# Patient Record
Sex: Female | Born: 2000 | Race: White | Hispanic: No | Marital: Single | Smoking: Never smoker
Health system: Southern US, Community
[De-identification: ages and names within clinical notes are randomized; demographics above are authoritative.]

## PROBLEM LIST (undated history)

## (undated) HISTORY — PX: TONSILLECTOMY: SUR1361

## (undated) HISTORY — PX: EYE SURGERY: SHX253

---

## 2020-04-12 ENCOUNTER — Encounter (HOSPITAL_BASED_OUTPATIENT_CLINIC_OR_DEPARTMENT_OTHER): Payer: Self-pay | Admitting: *Deleted

## 2020-04-12 ENCOUNTER — Emergency Department (HOSPITAL_BASED_OUTPATIENT_CLINIC_OR_DEPARTMENT_OTHER)
Admission: EM | Admit: 2020-04-12 | Discharge: 2020-04-13 | Disposition: A | Discharge status: Home / Self Care | Payer: Medicaid Other | Attending: Emergency Medicine | Admitting: Emergency Medicine

## 2020-04-12 ENCOUNTER — Other Ambulatory Visit: Payer: Self-pay

## 2020-04-12 DIAGNOSIS — R55 Syncope and collapse: Secondary | ICD-10-CM | POA: Diagnosis not present

## 2020-04-12 DIAGNOSIS — S0993XA Unspecified injury of face, initial encounter: Secondary | ICD-10-CM | POA: Diagnosis present

## 2020-04-12 DIAGNOSIS — W19XXXA Unspecified fall, initial encounter: Secondary | ICD-10-CM | POA: Diagnosis not present

## 2020-04-12 DIAGNOSIS — Z91040 Latex allergy status: Secondary | ICD-10-CM | POA: Insufficient documentation

## 2020-04-12 DIAGNOSIS — R7309 Other abnormal glucose: Secondary | ICD-10-CM | POA: Insufficient documentation

## 2020-04-12 DIAGNOSIS — S022XXA Fracture of nasal bones, initial encounter for closed fracture: Secondary | ICD-10-CM | POA: Diagnosis not present

## 2020-04-12 LAB — CBG MONITORING, ED: Glucose-Capillary: 113 mg/dL — ABNORMAL HIGH (ref 70–99)

## 2020-04-12 NOTE — ED Triage Notes (Signed)
She went with her friend for a body piercing. Suddenly she passed out falling forward. Injury to her nose, upper gums. She has a small hematoma over her right eye with superficial abrasions.

## 2020-04-13 ENCOUNTER — Emergency Department (HOSPITAL_BASED_OUTPATIENT_CLINIC_OR_DEPARTMENT_OTHER): Payer: Medicaid Other

## 2020-04-13 NOTE — Discharge Instructions (Signed)
Apply ice for 30 minutes at a time, 4 times a day. ° °Take acetaminophen or ibuprofen as needed for pain. °

## 2020-04-13 NOTE — ED Notes (Signed)
Patient transported to CT 

## 2020-04-13 NOTE — ED Provider Notes (Signed)
[facility] EMERGENCY DEPARTMENT Provider Note   CSN: [id number] Arrival date & time: 04/12/20  2106   History Chief complaint: Facial injury  Kathy Ramos is a 19 y.o. female.  The history is provided by the patient.  She was with a friend who is getting her nose pierced when patient suddenly felt and passed out.  She woke up on the floor with her nose bleeding and some bruising to her upper lip and abrasions to her forehead.  She did feel sweaty and nauseous prior to passing out.  History reviewed. No pertinent past medical history.  There are no problems to display for this patient.   Past Surgical History:  Procedure Laterality Date  . EYE SURGERY    . TONSILLECTOMY       OB History   No obstetric history on file.     No family history on file.  Social History   Tobacco Use  . Smoking status: Never Smoker  . Smokeless tobacco: Never Used  Substance Use Topics  . Alcohol use: Never  . Drug use: Never    Home Medications Prior to Admission medications   Not on File    Allergies    Latex  Review of Systems   Review of Systems  All other systems reviewed and are negative.   Physical Exam Updated Vital Signs BP 115/77 (BP Location: Right Arm)   Pulse 96   Temp 98.6 F (37 C) (Oral)   Resp 18   Ht 5\' 5"  (1.651 m)   Wt 71.2 kg   LMP 03/15/2020   SpO2 100%   BMI 26.13 kg/m   Physical Exam Vitals and nursing note reviewed.   19 year old female, resting comfortably and in no acute distress. Vital signs are normal. Oxygen saturation is 100%, which is normal. Head is normocephalic.  2 small superficial lacerations are present on the forehead, not requiring closure. PERRLA, EOMI. Oropharynx is clear.  No active nose bleeding.  Septum is in the midline.  No obvious bleeding site seen.  There is tenderness to palpation over the bridge of the nose without any swelling seen.  Some swelling noted of the upper lip and some bleeding of the gingiva  around tooth #8 and 9.  Teeth are not loosened. Neck is nontender and supple without adenopathy or JVD. Back is nontender and there is no CVA tenderness. Lungs are clear without rales, wheezes, or rhonchi. Chest is nontender. Heart has regular rate and rhythm without murmur. Abdomen is soft, flat, nontender without masses or hepatosplenomegaly and peristalsis is normoactive. Extremities have no cyanosis or edema, full range of motion is present. Skin is warm and dry without rash. Neurologic: Mental status is normal, cranial nerves are intact, there are no motor or sensory deficits.  ED Results / Procedures / Treatments   Labs (all labs ordered are listed, but only abnormal results are displayed) Labs Reviewed  CBG MONITORING, ED - Abnormal; Notable for the following components:      Result Value   Glucose-Capillary 113 (*)    All other components within normal limits  PREGNANCY, URINE    EKG EKG Interpretation  Date/Time:  Thursday April 12 2020 21:22:55 EST Ventricular Rate:  109 PR Interval:  152 QRS Duration: 82 QT Interval:  342 QTC Calculation: 460 R Axis:   69 Text Interpretation: Sinus tachycardia Otherwise normal ECG No old tracing to compare Confirmed by [id number] (Dione Booze) on 04/12/2020 11:58:22 PM   Radiology CT Maxillofacial Wo  Contrast  Result Date: 04/13/2020 CLINICAL DATA:  Syncope while accompanying friend for body piercing. Larey Seat forward with injury to nose and upper gums, supraorbital hematoma with abrasions. EXAM: CT MAXILLOFACIAL WITHOUT CONTRAST TECHNIQUE: Multidetector CT imaging of the maxillofacial structures was performed. Multiplanar CT image reconstructions were also generated. COMPARISON:  None. FINDINGS: Osseous: No fracture of the bony orbits. Minimal deformity of the left nasal bone may reflect an acute fracture given some overlying swelling. No other mid face fractures are seen. The pterygoid plates are intact. No visible or suspected temporal  bone fractures. Temporomandibular joints are normally aligned. The mandible is intact. No fractured or avulsed teeth. Crown of the left first maxillary molar with periapical lucencies. Included cervical spine is free of acute osseous abnormality or traumatic listhesis. Orbits: Medial right supraorbital swelling confined to the preseptal tissues. No retro septal stranding or hemorrhage. The globes appear normal and symmetric. Symmetric appearance of the extraocular musculature and optic nerve sheath complexes. Normal caliber of the superior ophthalmic veins. Sinuses: Paranasal sinuses and mastoid air cells are predominantly clear. Soft tissues: Focal medial right supraorbital soft tissue swelling. Additional thickening across the nasal bridge and upper lip/philtrum Limited intracranial: No evidence of acute infarction, hemorrhage, hydrocephalus, extra-axial collection, visible mass lesion or mass effect. IMPRESSION: 1. Minimal deformity of the left nasal bone may reflect an acute fracture given some overlying swelling. Correlate with point tenderness. 2. No other visible facial bone fracture. 3. Medial right supraorbital soft tissue swelling confined to the preseptal tissues. No retro septal stranding or hemorrhage or other acute abnormality of the orbits. 4. Crown of the left first maxillary molar with periapical lucencies. Correlate with dental exam. Electronically Signed   By: Kreg Shropshire M.D.   On: 04/13/2020 00:22    Procedures Procedures   Medications Ordered in ED Medications - No data to display  ED Course  I have reviewed the triage vital signs and the nursing notes.  Pertinent labs & imaging results that were available during my care of the patient were reviewed by me and considered in my medical decision making (see chart for details).  MDM Rules/Calculators/A&P Syncope which sounds like vasovagal syncope.  ECG is normal except for slightly fast heart rate, glucose is minimally elevated at  113.  No additional work-up needed for syncope.  Facial injury, will send for CT to make sure there is no underlying facial fracture.  Old records are reviewed, and she has no relevant past records.  CT scan shows possible nasal fracture.  Given her to nasal tenderness, this is likely a true finding.  She is advised to apply ice, use over-the-counter analgesics as needed for pain, referred to ENT for follow-up if she has ongoing difficulty breathing or any cosmetic issues with the nasal healing.  Final Clinical Impression(s) / ED Diagnoses Final diagnoses:  Vasovagal syncope  Closed fracture of nasal bone, initial encounter    Rx / DC Orders ED Discharge Orders    None       Dione Booze, MD 04/13/20 0110

## 2021-02-10 ENCOUNTER — Emergency Department (HOSPITAL_BASED_OUTPATIENT_CLINIC_OR_DEPARTMENT_OTHER)
Admission: EM | Admit: 2021-02-10 | Discharge: 2021-02-11 | Disposition: A | Discharge status: Home / Self Care | Payer: Medicaid Other | Attending: Emergency Medicine | Admitting: Emergency Medicine

## 2021-02-10 ENCOUNTER — Other Ambulatory Visit: Payer: Self-pay

## 2021-02-10 ENCOUNTER — Encounter (HOSPITAL_BASED_OUTPATIENT_CLINIC_OR_DEPARTMENT_OTHER): Payer: Self-pay | Admitting: *Deleted

## 2021-02-10 ENCOUNTER — Emergency Department (HOSPITAL_BASED_OUTPATIENT_CLINIC_OR_DEPARTMENT_OTHER): Payer: Medicaid Other

## 2021-02-10 DIAGNOSIS — Z91040 Latex allergy status: Secondary | ICD-10-CM | POA: Diagnosis not present

## 2021-02-10 DIAGNOSIS — R059 Cough, unspecified: Secondary | ICD-10-CM | POA: Diagnosis not present

## 2021-02-10 DIAGNOSIS — J069 Acute upper respiratory infection, unspecified: Secondary | ICD-10-CM

## 2021-02-10 DIAGNOSIS — Z20822 Contact with and (suspected) exposure to covid-19: Secondary | ICD-10-CM | POA: Insufficient documentation

## 2021-02-10 LAB — RESP PANEL BY RT-PCR (FLU A&B, COVID) ARPGX2
Influenza A by PCR: NEGATIVE
Influenza B by PCR: NEGATIVE
SARS Coronavirus 2 by RT PCR: NEGATIVE

## 2021-02-10 MED ORDER — FLUTICASONE PROPIONATE 50 MCG/ACT NA SUSP: 2.0000 | Freq: Every day | NASAL | 0 refills | Status: AC

## 2021-02-10 MED ORDER — ACETAMINOPHEN 325 MG PO TABS: 650.0000 mg | ORAL_TABLET | Freq: Once | ORAL | Status: DC

## 2021-02-10 MED ORDER — ACETAMINOPHEN 325 MG PO TABS
ORAL_TABLET | ORAL | Status: AC
  Filled 2021-02-10: qty 2

## 2021-02-10 MED ORDER — ACETAMINOPHEN 160 MG/5ML PO SOLN: 650.0000 mg | Freq: Once | ORAL | Status: DC

## 2021-02-10 MED ORDER — ACETAMINOPHEN 160 MG/5ML PO SOLN
650.0000 mg | Freq: Once | ORAL | Status: AC
  Administered 2021-02-10: 650 mg via ORAL
  Filled 2021-02-10: qty 20.3

## 2021-02-10 MED ORDER — BENZONATATE 100 MG PO CAPS
200.0000 mg | ORAL_CAPSULE | Freq: Once | ORAL | Status: AC
  Administered 2021-02-10: 200 mg via ORAL
  Filled 2021-02-10: qty 2

## 2021-02-10 MED ORDER — BENZONATATE 100 MG PO CAPS: 100.0000 mg | ORAL_CAPSULE | Freq: Three times a day (TID) | ORAL | 0 refills | Status: AC

## 2021-02-10 NOTE — ED Provider Notes (Signed)
[facility] EMERGENCY DEPARTMENT Provider Note   CSN: [id number] Arrival date & time: 02/10/21  1943     History Chief Complaint  Patient presents with   Cough    Kathy Ramos is a 20 y.o. female.  The history is provided by the patient.  Cough Cough characteristics:  Non-productive Severity:  Moderate Onset quality:  Gradual Duration:  1 week Timing:  Intermittent Progression:  Unchanged Chronicity:  New Smoker: no   Context: upper respiratory infection   Relieved by:  Nothing Worsened by:  Nothing Ineffective treatments:  None tried Associated symptoms: sinus congestion   Associated symptoms: no chest pain, no chills, no diaphoresis, no ear fullness, no ear pain, no fever, no myalgias, no rash, no rhinorrhea, no shortness of breath and no wheezing   Risk factors: no chemical exposure       History reviewed. No pertinent past medical history.  There are no problems to display for this patient.   Past Surgical History:  Procedure Laterality Date   EYE SURGERY     TONSILLECTOMY       OB History   No obstetric history on file.     History reviewed. No pertinent family history.  Social History   Tobacco Use   Smoking status: Never   Smokeless tobacco: Never  Vaping Use   Vaping Use: Never used  Substance Use Topics   Alcohol use: Never   Drug use: Never    Home Medications Prior to Admission medications   Not on File    Allergies    Latex  Review of Systems   Review of Systems  Constitutional:  Negative for chills, diaphoresis and fever.  HENT:  Negative for ear pain, rhinorrhea, trouble swallowing and voice change.   Eyes:  Negative for redness.  Respiratory:  Positive for cough. Negative for shortness of breath and wheezing.   Cardiovascular:  Negative for chest pain.  Gastrointestinal:  Negative for vomiting.  Genitourinary:  Negative for difficulty urinating.  Musculoskeletal:  Negative for myalgias.  Skin:  Negative for  rash.  Neurological:  Negative for facial asymmetry.  Psychiatric/Behavioral:  Negative for agitation.   All other systems reviewed and are negative.  Physical Exam Updated Vital Signs BP 115/80 (BP Location: Right Arm)   Pulse 91   Temp 99.9 F (37.7 C) (Oral)   Resp 18   Ht 5\' 5"  (1.651 m)   Wt 73 kg   LMP 02/09/2021   SpO2 100%   BMI 26.79 kg/m   Physical Exam Vitals and nursing note reviewed.  Constitutional:      General: She is not in acute distress.    Appearance: Normal appearance.  HENT:     Head: Normocephalic and atraumatic.     Nose: Nose normal.  Eyes:     Conjunctiva/sclera: Conjunctivae normal.     Pupils: Pupils are equal, round, and reactive to light.  Cardiovascular:     Rate and Rhythm: Normal rate and regular rhythm.     Pulses: Normal pulses.     Heart sounds: Normal heart sounds.  Pulmonary:     Effort: Pulmonary effort is normal. No respiratory distress.     Breath sounds: Normal breath sounds. No wheezing or rales.  Abdominal:     General: Abdomen is flat. Bowel sounds are normal.     Palpations: Abdomen is soft.     Tenderness: There is no abdominal tenderness. There is no guarding.  Musculoskeletal:  General: Normal range of motion.     Cervical back: Normal range of motion and neck supple. No rigidity.  Lymphadenopathy:     Cervical: No cervical adenopathy.  Skin:    General: Skin is warm and dry.     Capillary Refill: Capillary refill takes less than 2 seconds.  Neurological:     General: No focal deficit present.     Mental Status: She is alert and oriented to person, place, and time.     Deep Tendon Reflexes: Reflexes normal.  Psychiatric:        Mood and Affect: Mood normal.        Behavior: Behavior normal.    ED Results / Procedures / Treatments   Labs (all labs ordered are listed, but only abnormal results are displayed) Labs Reviewed  RESP PANEL BY RT-PCR (FLU A&B, COVID) ARPGX2    EKG None  Radiology DG  Chest 2 View  Result Date: 02/10/2021 CLINICAL DATA:  Cough for 1 week. EXAM: CHEST - 2 VIEW COMPARISON:  None. FINDINGS: The cardiomediastinal contours are normal. The lungs are clear. Pulmonary vasculature is normal. No consolidation, pleural effusion, or pneumothorax. No acute osseous abnormalities are seen. IMPRESSION: Negative radiographs of the chest. Electronically Signed   By: Narda Rutherford M.D.   On: 02/10/2021 20:33    Procedures Procedures   Medications Ordered in ED Medications  benzonatate (TESSALON) capsule 200 mg (has no administration in time range)    ED Course  I have reviewed the triage vital signs and the nursing notes.  Pertinent labs & imaging results that were available during my care of the patient were reviewed by me and considered in my medical decision making (see chart for details).   Viral illness, flonase and tessalon and tylenol for aches and pains.  Well appearing, stable for discharge.     Kathy Ramos was evaluated in Emergency Department on 02/10/2021 for the symptoms described in the history of present illness. She was evaluated in the context of the global COVID-19 pandemic, which necessitated consideration that the patient might be at risk for infection with the SARS-CoV-2 virus that causes COVID-19. Institutional protocols and algorithms that pertain to the evaluation of patients at risk for COVID-19 are in a state of rapid change based on information released by regulatory bodies including the CDC and federal and state organizations. These policies and algorithms were followed during the patient's care in the ED.  Final Clinical Impression(s) / ED Diagnoses Final diagnoses:  None  Return for intractable cough, coughing up blood, fevers > 100.4 unrelieved by medication, shortness of breath, intractable vomiting, chest pain, shortness of breath, weakness, numbness, changes in speech, facial asymmetry, abdominal pain, passing out, Inability to tolerate  liquids or food, cough, altered mental status or any concerns. No signs of systemic illness or infection. The patient is nontoxic-appearing on exam and vital signs are within normal limits.  I have reviewed the triage vital signs and the nursing notes. Pertinent labs & imaging results that were available during my care of the patient were reviewed by me and considered in my medical decision making (see chart for details). After history, exam, and medical workup I feel the patient has been appropriately medically screened and is safe for discharge home. Pertinent diagnoses were discussed with the patient. Patient was given return precautions.   Rx / DC Orders ED Discharge Orders     None        Elijan Googe, MD 02/10/21 2346

## 2021-02-10 NOTE — ED Triage Notes (Signed)
Pt reports cough x 1 week. Had neg strep test last week. States still coughing, denies known fever, reports chills

## 2021-02-11 NOTE — ED Notes (Signed)
Discharge instructions discussed with pt. Pt verbalized understanding. Pt stable and ambulatory.  °

## 2021-05-03 ENCOUNTER — Encounter (HOSPITAL_BASED_OUTPATIENT_CLINIC_OR_DEPARTMENT_OTHER): Payer: Self-pay | Admitting: *Deleted

## 2021-05-03 ENCOUNTER — Emergency Department (HOSPITAL_BASED_OUTPATIENT_CLINIC_OR_DEPARTMENT_OTHER)
Admission: EM | Admit: 2021-05-03 | Discharge: 2021-05-04 | Disposition: A | Discharge status: Home / Self Care | Payer: Medicaid Other | Attending: Emergency Medicine | Admitting: Emergency Medicine

## 2021-05-03 ENCOUNTER — Other Ambulatory Visit: Payer: Self-pay

## 2021-05-03 DIAGNOSIS — U071 COVID-19: Secondary | ICD-10-CM | POA: Diagnosis not present

## 2021-05-03 DIAGNOSIS — R059 Cough, unspecified: Secondary | ICD-10-CM | POA: Diagnosis present

## 2021-05-03 LAB — RESP PANEL BY RT-PCR (FLU A&B, COVID) ARPGX2
Influenza A by PCR: NEGATIVE
Influenza B by PCR: NEGATIVE
SARS Coronavirus 2 by RT PCR: POSITIVE — AB

## 2021-05-03 MED ORDER — ACETAMINOPHEN 160 MG/5ML PO SOLN
650.0000 mg | Freq: Once | ORAL | Status: AC
  Administered 2021-05-03: 22:00:00 650 mg via ORAL
  Filled 2021-05-03: qty 20.3

## 2021-05-03 NOTE — ED Triage Notes (Signed)
Covid + x 1 day with sx x 2 days, cough fever , h/a body aches

## 2021-05-04 NOTE — Discharge Instructions (Signed)
Drink plenty of fluids and get plenty of rest.  Take Tylenol 1000 mg rotated with ibuprofen 600 mg every 4 hours as needed for fever.  Take over-the-counter medications as needed for relief of symptoms.  Isolate at home for the next 5 days, and return to the ER if you develop severe chest pain, difficulty breathing, or other new and concerning symptoms.

## 2021-05-04 NOTE — ED Provider Notes (Signed)
[facility] EMERGENCY DEPARTMENT Provider Note   CSN: [id number] Arrival date & time: 05/03/21  2133     History  Chief Complaint  Patient presents with   Covid Positive    Kathy Ramos is a 21 y.o. female.  Patient is a 21 year old female presenting with complaints of fever, body aches, and cough.  This is been worsening since yesterday.  She took a home COVID test this afternoon that was positive.  She was told by her employer that she had to come to the hospital to get a "confirmatory test".  Patient denies to me she is having any difficulty breathing or chest pain.  She does arrive here febrile with temp of 103.  There are no aggravating or alleviating factors.  The history is provided by the patient.      Home Medications Prior to Admission medications   Medication Sig Start Date End Date Taking? Authorizing Provider  benzonatate (TESSALON) 100 MG capsule Take 1 capsule (100 mg total) by mouth every 8 (eight) hours. 02/10/21   Palumbo, April, MD  fluticasone (FLONASE) 50 MCG/ACT nasal spray Place 2 sprays into both nostrils daily. 02/10/21   Palumbo, April, MD      Allergies    Latex    Review of Systems   Review of Systems  All other systems reviewed and are negative.  Physical Exam Updated Vital Signs BP 135/78 (BP Location: Right Arm)    Pulse (!) 127    Temp (!) 103 F (39.4 C) (Oral)    Resp 16    Ht 5\' 5"  (1.651 m)    Wt 72.1 kg    LMP 04/30/2021    SpO2 98%    BMI 26.46 kg/m  Physical Exam Vitals and nursing note reviewed.  Constitutional:      General: She is not in acute distress.    Appearance: She is well-developed. She is not diaphoretic.  HENT:     Head: Normocephalic and atraumatic.  Cardiovascular:     Rate and Rhythm: Normal rate and regular rhythm.     Heart sounds: No murmur heard.   No friction rub. No gallop.  Pulmonary:     Effort: Pulmonary effort is normal. No respiratory distress.     Breath sounds: Normal breath sounds. No  wheezing.  Abdominal:     General: Bowel sounds are normal. There is no distension.     Palpations: Abdomen is soft.     Tenderness: There is no abdominal tenderness.  Musculoskeletal:        General: Normal range of motion.     Cervical back: Normal range of motion and neck supple.  Skin:    General: Skin is warm and dry.  Neurological:     General: No focal deficit present.     Mental Status: She is alert and oriented to person, place, and time.    ED Results / Procedures / Treatments   Labs (all labs ordered are listed, but only abnormal results are displayed) Labs Reviewed  RESP PANEL BY RT-PCR (FLU A&B, COVID) ARPGX2 - Abnormal; Notable for the following components:      Result Value   SARS Coronavirus 2 by RT PCR POSITIVE (*)    All other components within normal limits    EKG None  Radiology No results found.  Procedures Procedures    Medications Ordered in ED Medications  acetaminophen (TYLENOL) 160 MG/5ML solution 650 mg (650 mg Oral Given 05/03/21 2155)    ED Course/  Medical Decision Making/ A&P  Patient presenting here with URI symptoms and fever.  She had positive COVID test at but was told by her employer to have her test confirmed.  She arrives here febrile with temp of 103 and was given Tylenol.  Vital signs are otherwise stable and she is well-appearing.  At this point I feel as though patient can safely be discharged.  Her COVID test here is positive.  Final Clinical Impression(s) / ED Diagnoses Final diagnoses:  None    Rx / DC Orders ED Discharge Orders     None         Geoffery Lyons, MD 05/04/21 [phone]

## 2022-03-23 IMAGING — CT CT MAXILLOFACIAL W/O CM
3 series · 15 of 47 positions shown, 18 images · non-contrast
Comparison: None.

CLINICAL DATA: Syncope while accompanying friend for body piercing.
Fell forward with injury to nose and upper gums, supraorbital
hematoma with abrasions.

EXAM:
CT MAXILLOFACIAL WITHOUT CONTRAST
TECHNIQUE: Multidetector CT imaging of the maxillofacial structures was
performed. Multiplanar CT image reconstructions were also generated.

[Series 2: max soft · axial · 0.36mm/px · z∈[+96,+216]mm · 9 of 70 slices shown, 12 images]
[im 5/70  brain]
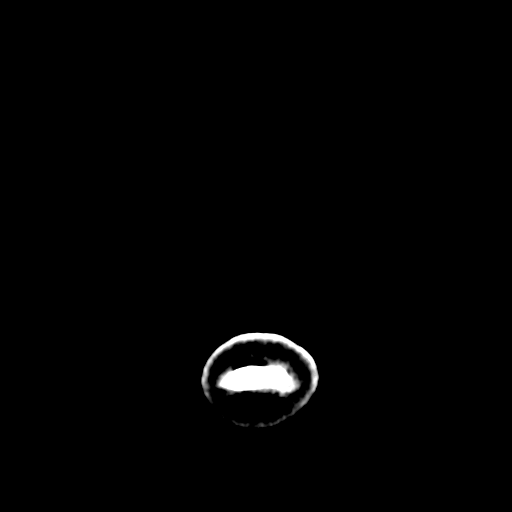
[im 5/70  bone]
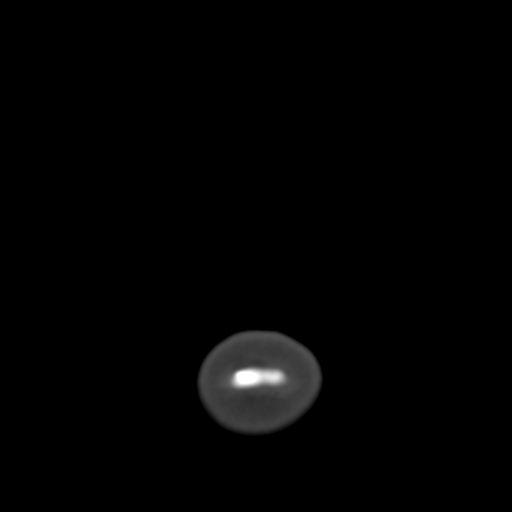
[im 12/70  bone]
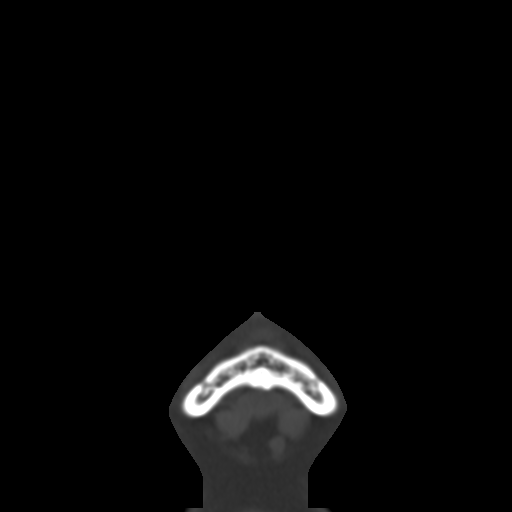
[im 20/70  bone]
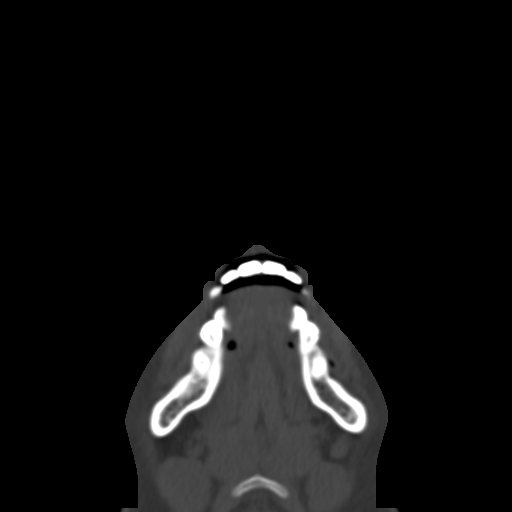
[im 27/70  bone]
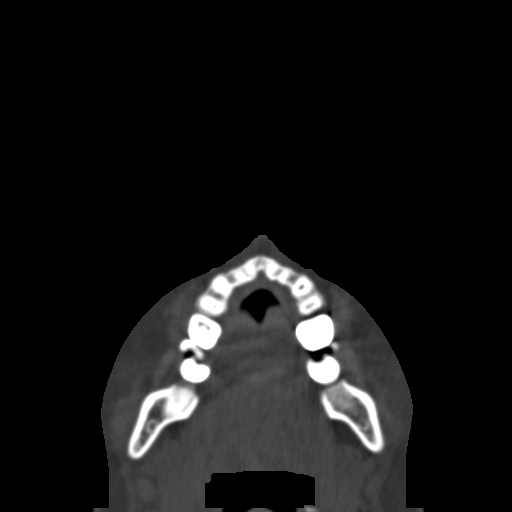
[im 36/70  brain]
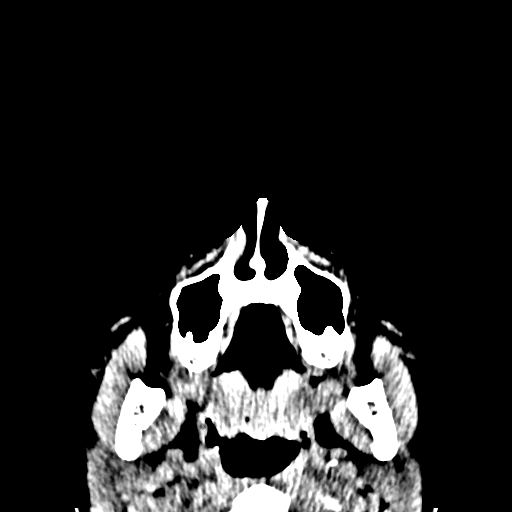
[im 36/70  bone]
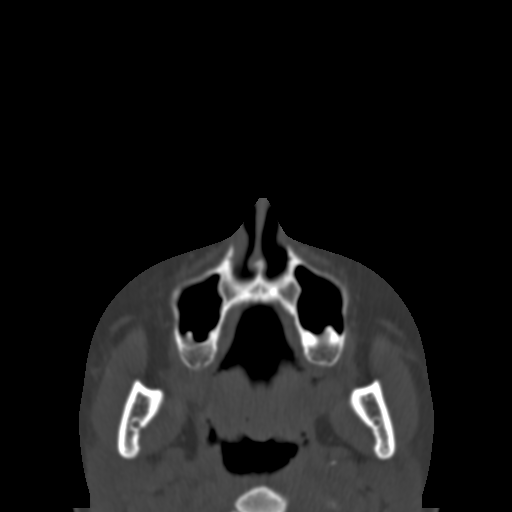
[im 43/70  bone]
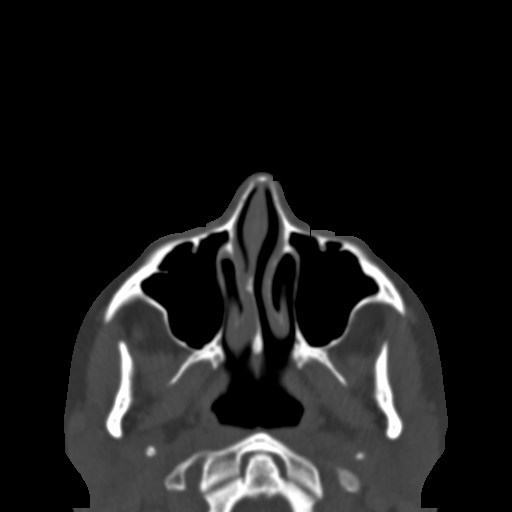
[im 50/70  bone]
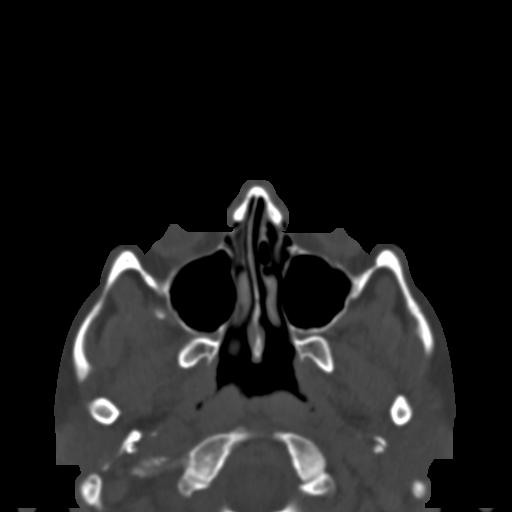
[im 58/70  bone]
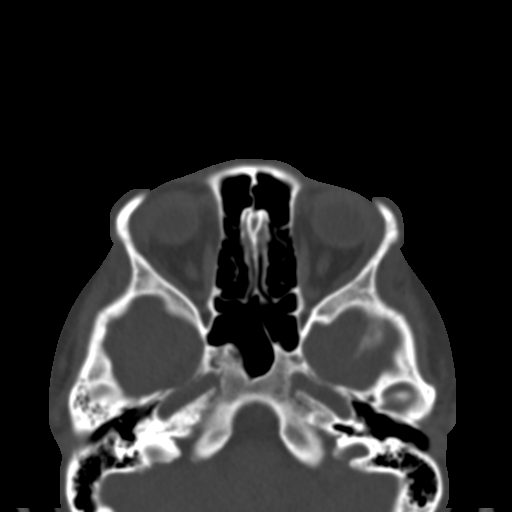
[im 65/70  brain]
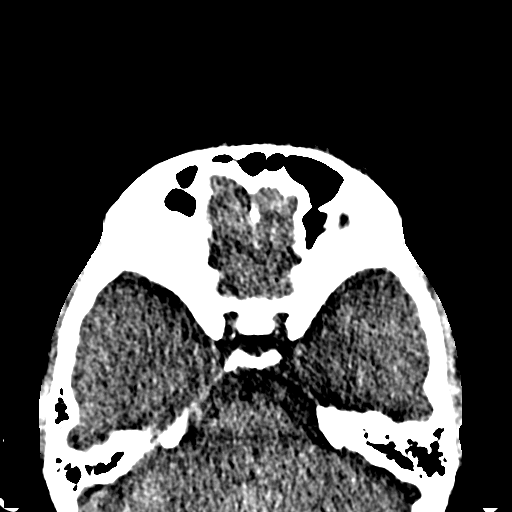
[im 65/70  bone]
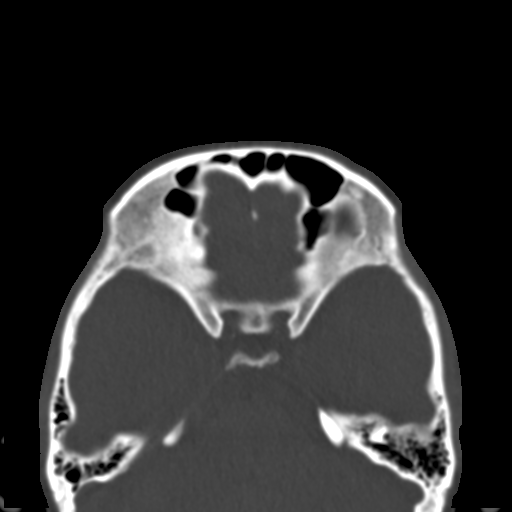

[Series 6: coronal soft · coronal · 0.36mm/px · 3 of 73 slices shown]
[im 25/73  bone]
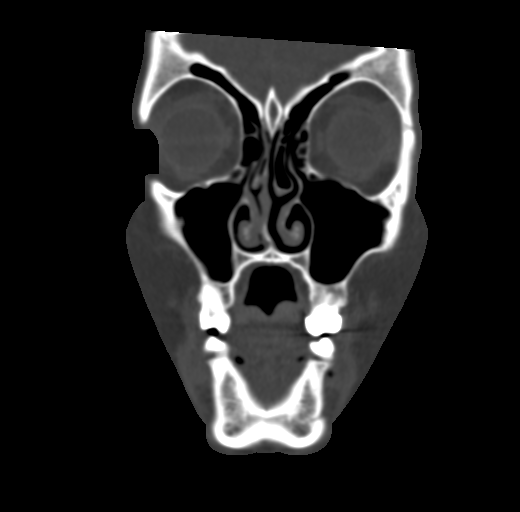
[im 33/73  bone]
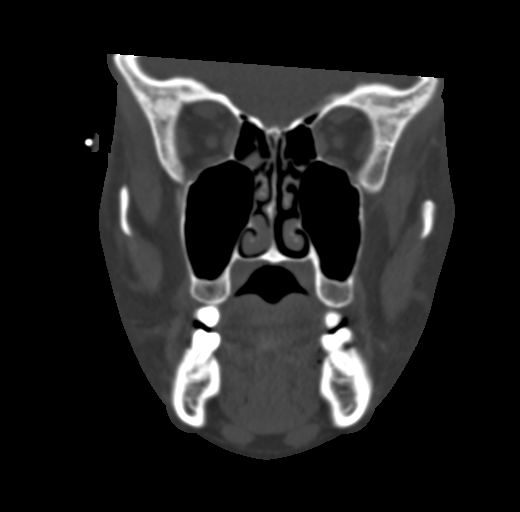
[im 41/73  bone]
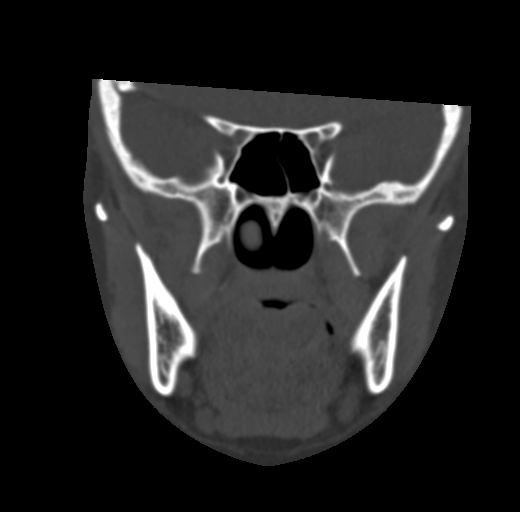

[Series 7: sagittal soft · sagittal · 0.31mm/px · 3 of 76 slices shown]
[im 26/76  bone]
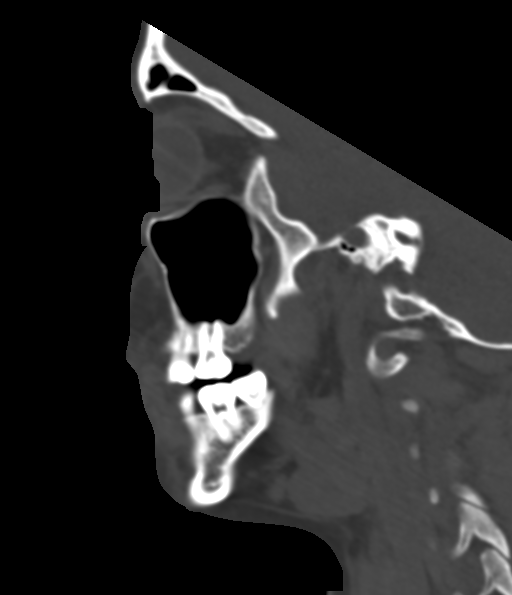
[im 38/76  bone]
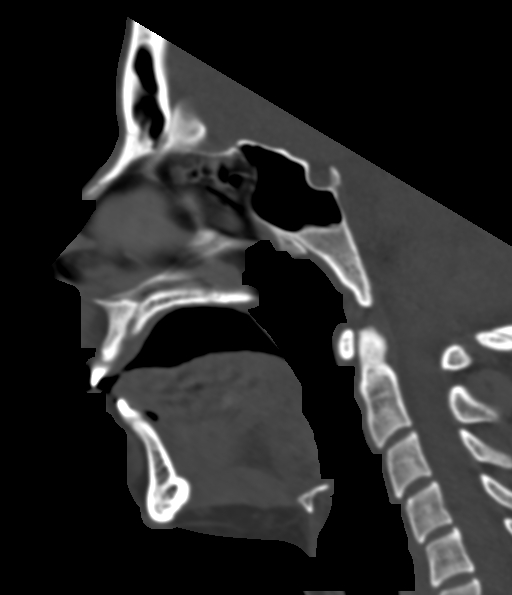
[im 51/76  bone]
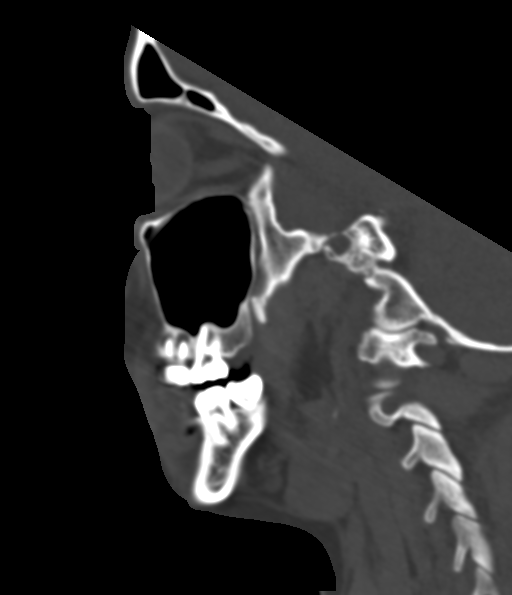

[15 of 47 positions shown; findings below may reference images not displayed]

FINDINGS: Osseous: No fracture of the bony orbits. Minimal deformity of the
left nasal bone may reflect an acute fracture given some overlying
swelling. No other mid face fractures are seen. The pterygoid plates
are intact. No visible or suspected temporal bone fractures.
Temporomandibular joints are normally aligned. The mandible is
intact. No fractured or avulsed teeth. Crown of the left first
maxillary molar with periapical lucencies. Included cervical spine
is free of acute osseous abnormality or traumatic listhesis.

Orbits: Medial right supraorbital swelling confined to the preseptal
tissues. No retro septal stranding or hemorrhage. The globes appear
normal and symmetric. Symmetric appearance of the extraocular
musculature and optic nerve sheath complexes. Normal caliber of the
superior ophthalmic veins.

Sinuses: Paranasal sinuses and mastoid air cells are predominantly
clear.

Soft tissues: Focal medial right supraorbital soft tissue swelling.
Additional thickening across the nasal bridge and upper lip/philtrum

Limited intracranial: No evidence of acute infarction, hemorrhage,
hydrocephalus, extra-axial collection, visible mass lesion or mass
effect.
IMPRESSION: 1. Minimal deformity of the left nasal bone may reflect an acute
fracture given some overlying swelling. Correlate with point
tenderness.
2. No other visible facial bone fracture.
3. Medial right supraorbital soft tissue swelling confined to the
preseptal tissues. No retro septal stranding or hemorrhage or other
acute abnormality of the orbits.
4. Crown of the left first maxillary molar with periapical
lucencies. Correlate with dental exam.

## 2022-12-28 ENCOUNTER — Other Ambulatory Visit: Payer: Self-pay

## 2022-12-28 ENCOUNTER — Encounter (HOSPITAL_BASED_OUTPATIENT_CLINIC_OR_DEPARTMENT_OTHER): Payer: Self-pay | Admitting: *Deleted

## 2022-12-28 ENCOUNTER — Emergency Department (HOSPITAL_BASED_OUTPATIENT_CLINIC_OR_DEPARTMENT_OTHER)
Admission: EM | Admit: 2022-12-28 | Discharge: 2022-12-28 | Disposition: A | Discharge status: Home / Self Care | Payer: No Typology Code available for payment source | Attending: Emergency Medicine | Admitting: Emergency Medicine

## 2022-12-28 DIAGNOSIS — Z91040 Latex allergy status: Secondary | ICD-10-CM | POA: Insufficient documentation

## 2022-12-28 DIAGNOSIS — R509 Fever, unspecified: Secondary | ICD-10-CM | POA: Diagnosis present

## 2022-12-28 DIAGNOSIS — U071 COVID-19: Secondary | ICD-10-CM | POA: Insufficient documentation

## 2022-12-28 NOTE — ED Triage Notes (Signed)
Pt has been sick with URI for a few days and tested positive for covid today at home.  No distress but wanted to be evaluated at the hospital.

## 2022-12-28 NOTE — ED Provider Notes (Signed)
   [place] [facility]  Provider Note  CSN: [id number] Arrival date & time: 12/28/22 2104  History Chief Complaint  Patient presents with   Covid Positive    Kathy Ramos is a 22 y.o. female with no significant PMH reports 4 days of URI symptoms including a fever today. She took a home Covid test and it was positive. She came to the ED 'for comfirmation' and because her work requires a note for absences.    Home Medications Prior to Admission medications   Medication Sig Start Date End Date Taking? Authorizing Provider  benzonatate (TESSALON) 100 MG capsule Take 1 capsule (100 mg total) by mouth every 8 (eight) hours. 02/10/21   Palumbo, April, MD  fluticasone (FLONASE) 50 MCG/ACT nasal spray Place 2 sprays into both nostrils daily. 02/10/21   Palumbo, April, MD     Allergies    Latex   Review of Systems   Review of Systems Please see HPI for pertinent positives and negatives  Physical Exam BP (!) 126/94   Pulse (!) 102   Temp 99 F (37.2 C) (Oral)   Resp 16   LMP 12/04/2022 (Approximate)   SpO2 100%   Physical Exam Vitals and nursing note reviewed.  HENT:     Head: Normocephalic.     Nose: Nose normal.  Eyes:     Extraocular Movements: Extraocular movements intact.  Pulmonary:     Effort: Pulmonary effort is normal.  Musculoskeletal:        General: Normal range of motion.     Cervical back: Neck supple.  Skin:    Findings: No rash (on exposed skin).  Neurological:     Mental Status: She is alert and oriented to person, place, and time.  Psychiatric:        Mood and Affect: Mood normal.     ED Results / Procedures / Treatments   EKG None  Procedures Procedures  Medications Ordered in the ED Medications - No data to display  Initial Impression and Plan  Patient here with URI and positive home Covid test. Patient reassured home tests are rarely falsely positive and that she does not need any confirmatory test in  the ED. She is otherwise well appearing, recommend she continue with supportive care at home. PCP follow up, RTED for any other concerns.    ED Course       MDM Rules/Calculators/A&P Medical Decision Making Problems Addressed: COVID-19: acute illness or injury     Final Clinical Impression(s) / ED Diagnoses Final diagnoses:  COVID-19    Rx / DC Orders ED Discharge Orders     None        Pollyann Savoy, MD 12/28/22 2314

## 2023-01-20 IMAGING — DX DG CHEST 2V
2 series · 2 of 2 positions shown · non-contrast
Comparison: None.

CLINICAL DATA: Cough for 1 week.

EXAM:
CHEST - 2 VIEW

[chest pa]
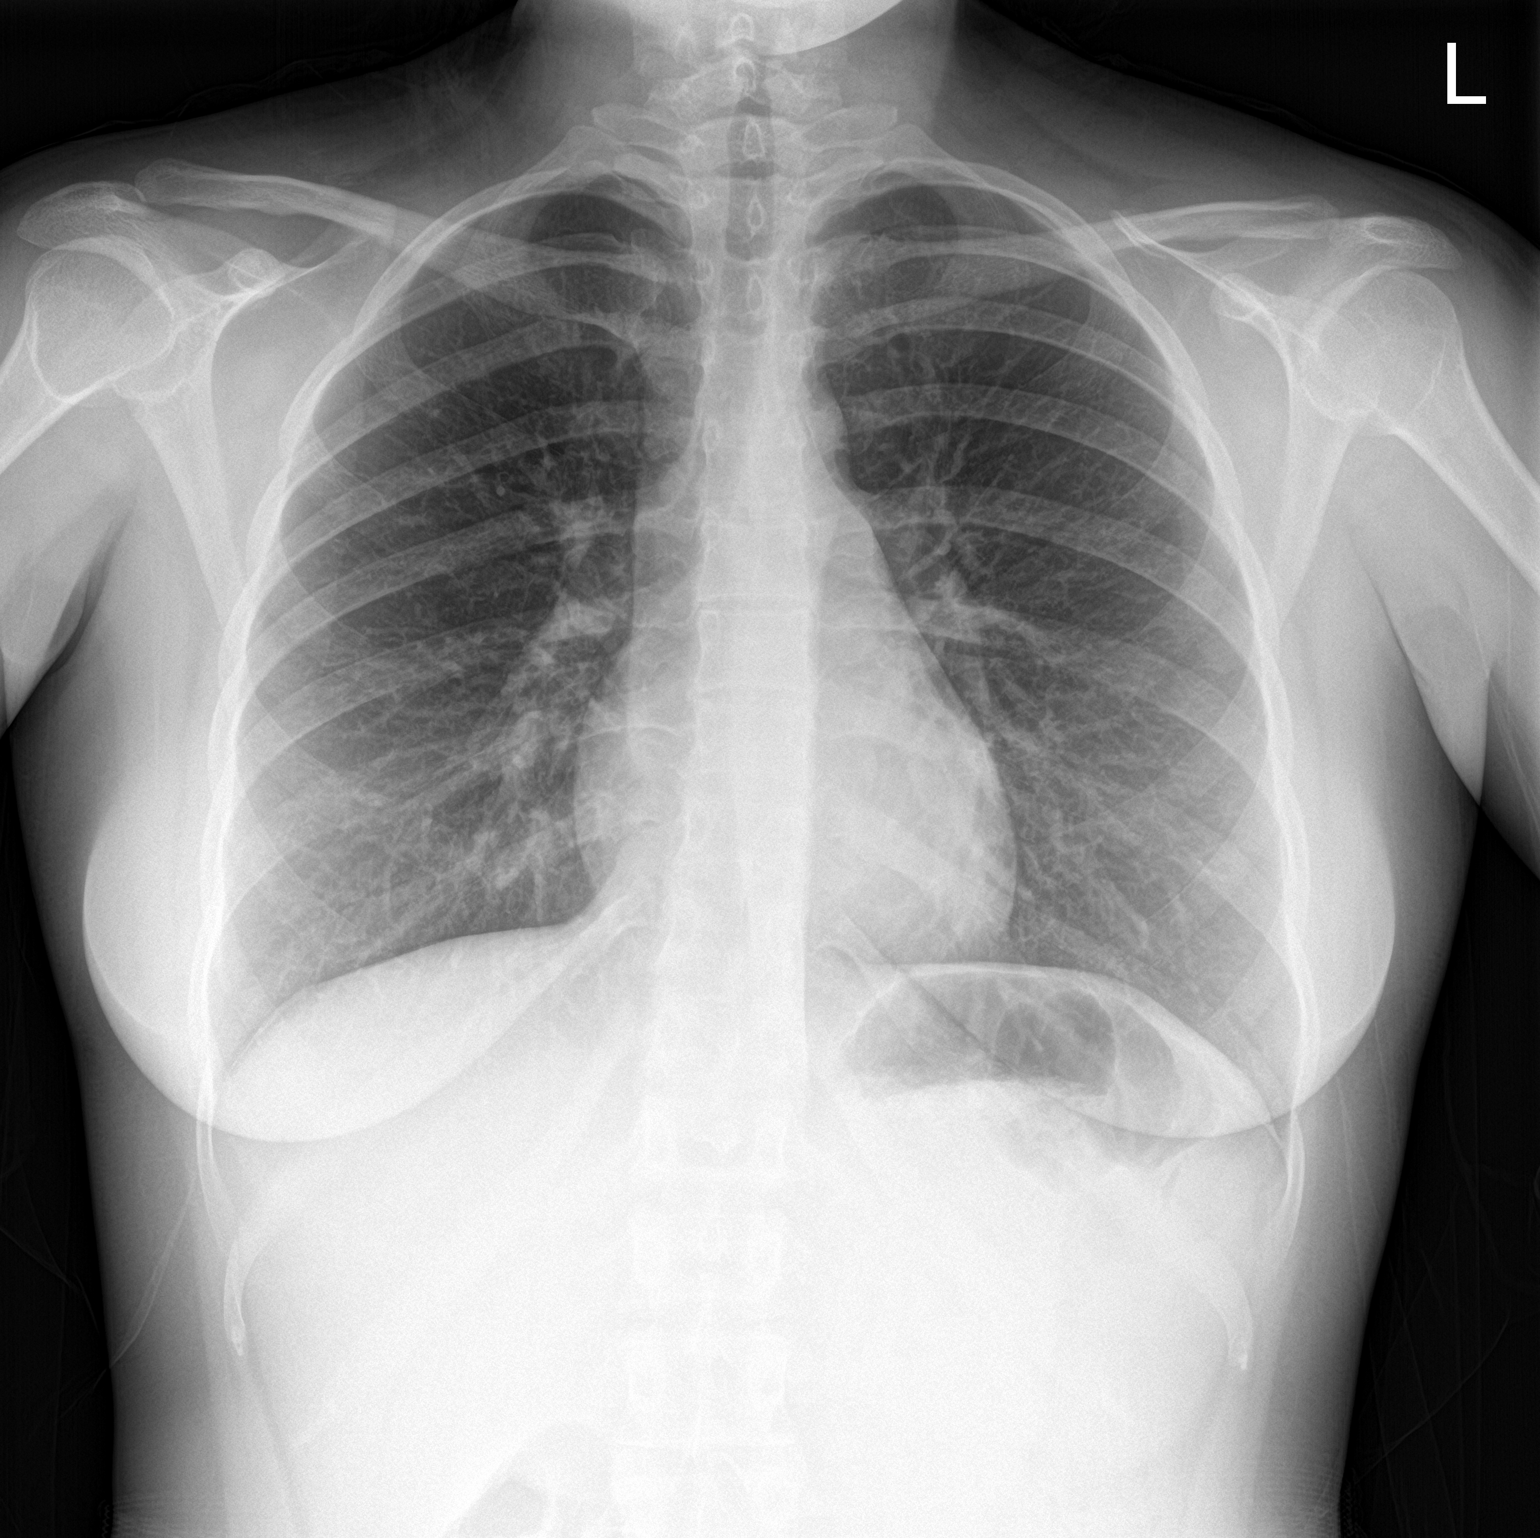

[chest lat]
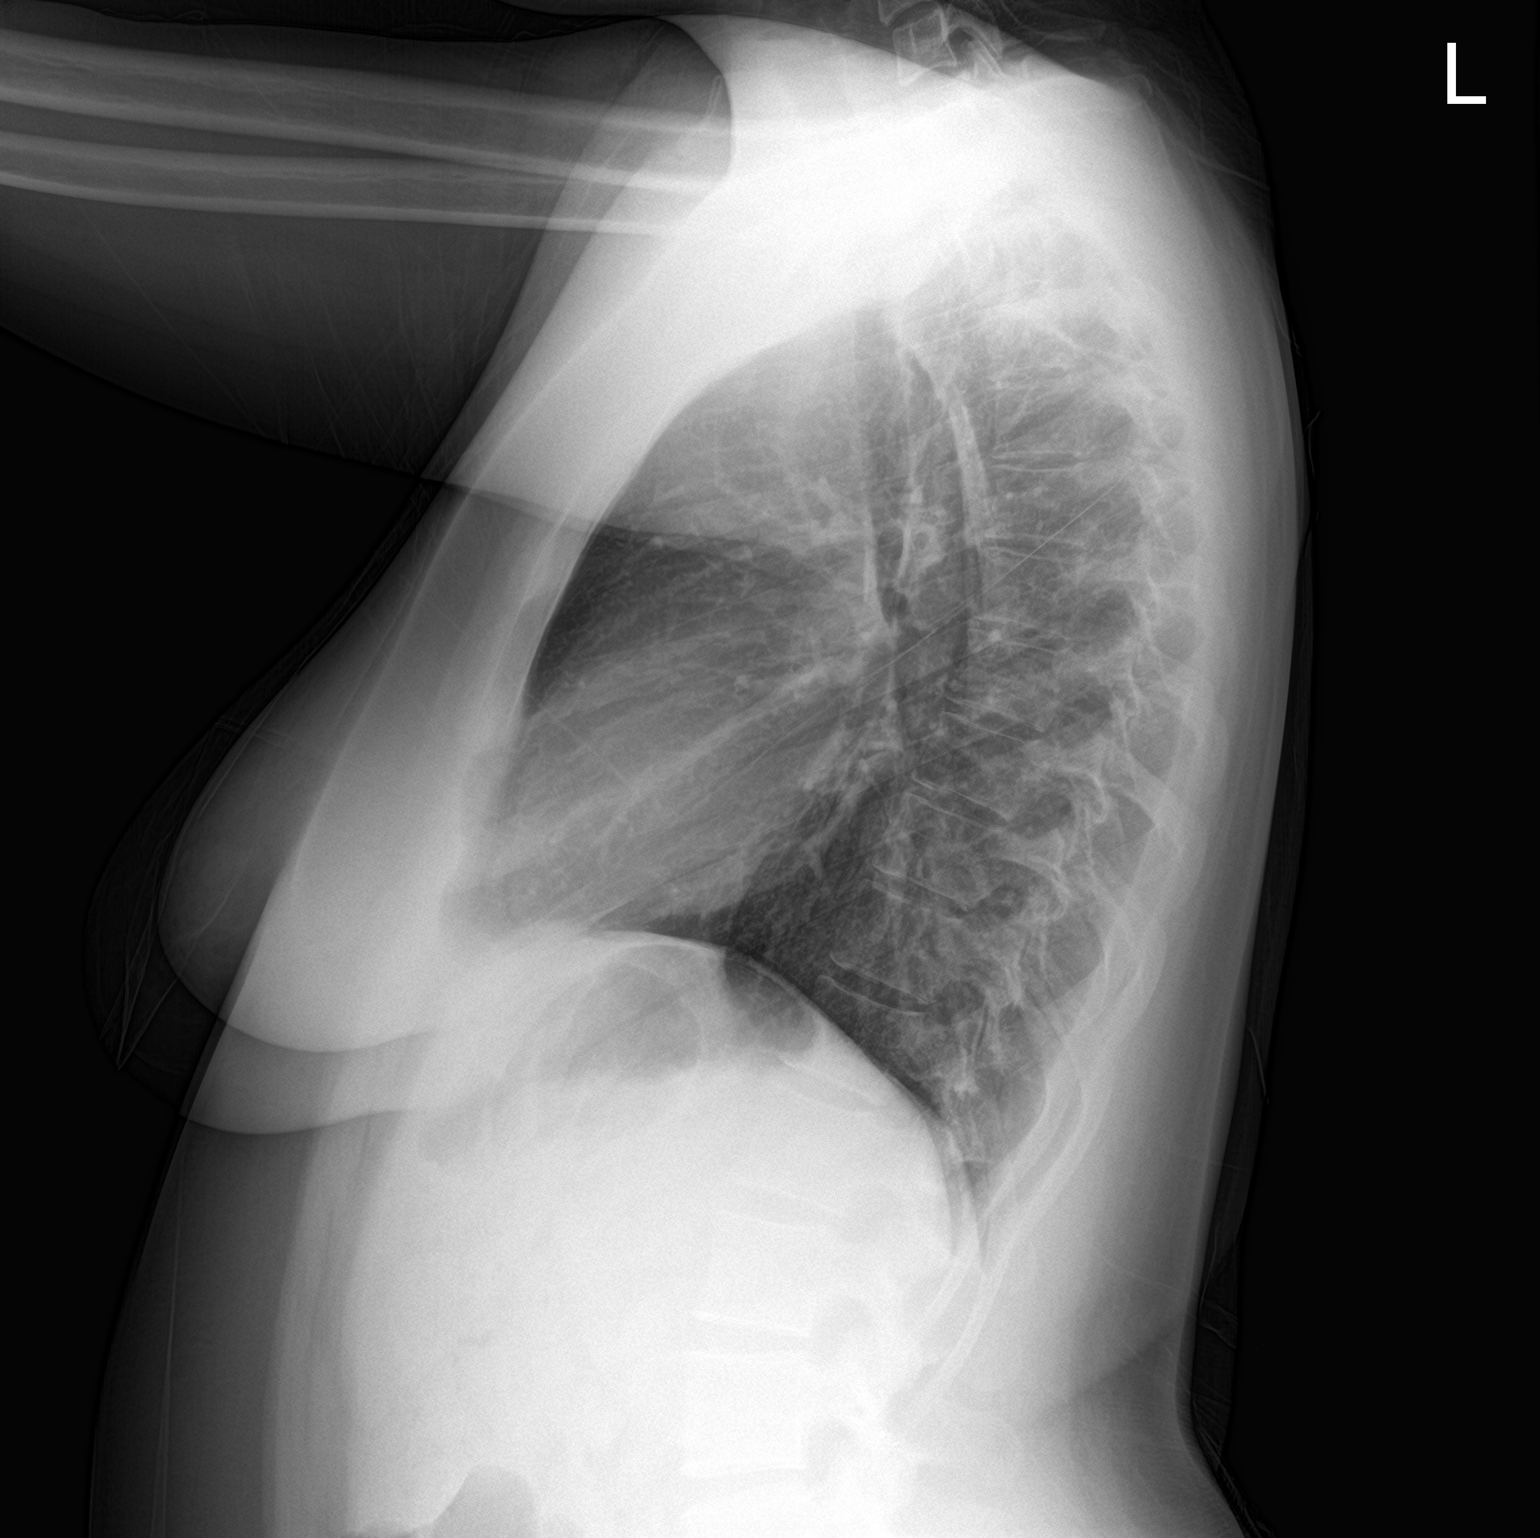

[2 of 2 positions shown; findings below may reference images not displayed]

FINDINGS: The cardiomediastinal contours are normal. The lungs are clear.
Pulmonary vasculature is normal. No consolidation, pleural effusion,
or pneumothorax. No acute osseous abnormalities are seen.
IMPRESSION: Negative radiographs of the chest.
